# Patient Record
Sex: Female | Born: 1975 | Race: White | Hispanic: No | Smoking: Never smoker
Health system: Southern US, Community
[De-identification: ages and names within clinical notes are randomized; demographics above are authoritative.]

## PROBLEM LIST (undated history)

## (undated) DIAGNOSIS — N390 Urinary tract infection, site not specified: Secondary | ICD-10-CM

## (undated) DIAGNOSIS — Z8639 Personal history of other endocrine, nutritional and metabolic disease: Secondary | ICD-10-CM

## (undated) DIAGNOSIS — E079 Disorder of thyroid, unspecified: Secondary | ICD-10-CM

## (undated) DIAGNOSIS — Z83518 Family history of other specified eye disorder: Secondary | ICD-10-CM

## (undated) HISTORY — DX: Family history of other specified eye disorder: Z83.518

## (undated) HISTORY — DX: Personal history of other endocrine, nutritional and metabolic disease: Z86.39

## (undated) HISTORY — DX: Urinary tract infection, site not specified: N39.0

## (undated) HISTORY — DX: Disorder of thyroid, unspecified: E07.9

---

## 2018-01-21 ENCOUNTER — Ambulatory Visit
Admission: EM | Admit: 2018-01-21 | Discharge: 2018-01-21 | Disposition: A | Discharge status: Home / Self Care | Payer: 59 | Attending: Family Medicine | Admitting: Family Medicine

## 2018-01-21 ENCOUNTER — Ambulatory Visit (INDEPENDENT_AMBULATORY_CARE_PROVIDER_SITE_OTHER): Payer: 59

## 2018-01-21 ENCOUNTER — Other Ambulatory Visit: Payer: Self-pay

## 2018-01-21 DIAGNOSIS — M79671 Pain in right foot: Secondary | ICD-10-CM

## 2018-01-21 DIAGNOSIS — W2209XA Striking against other stationary object, initial encounter: Secondary | ICD-10-CM

## 2018-01-21 DIAGNOSIS — S92501A Displaced unspecified fracture of right lesser toe(s), initial encounter for closed fracture: Secondary | ICD-10-CM | POA: Diagnosis not present

## 2018-01-21 NOTE — ED Provider Notes (Signed)
[facility] ____________________________________________  Time seen: Approximately 830 PM  I have reviewed the triage vital signs and the nursing notes.   HISTORY  Chief Complaint Foot Pain   HPI Charlotte Davila is a 42 y.o. female presented for evaluation of right lateral foot pain post injury that occurred approximately 1 hour prior to arrival.  Patient reports that she is walking to the house and accidentally kicked an object that was there and she did not see.  Reports pain since.  Denies fall, head injury or loss conscious.  Denies pain radiation.  Reports decreased movement to fourth and fifth toes.  Denies previous issues to the same areas.  Has not taken any over-the-counter medications for the same complaints.  No alleviating measures attempted.  States pain is worse with direct palpation and actually trying to walk.  Reports otherwise feels well denies other complaints.  Patient's last menstrual period was 12/31/2017 (approximate). Denies pregnancy.    History reviewed. No pertinent past medical history.  There are no active problems to display for this patient.   Past Surgical History:  Procedure Laterality Date  . CESAREAN SECTION       No current facility-administered medications for this encounter.  No current outpatient medications on file.  Allergies Bactrim [sulfamethoxazole-trimethoprim]; Ciprofloxacin; and Chlorhexidine  History reviewed. No pertinent family history.  Social History Social History   Tobacco Use  . Smoking status: Never Smoker  . Smokeless tobacco: Never Used  Substance Use Topics  . Alcohol use: No    Frequency: Never  . Drug use: No    Review of Systems Constitutional: No fever/chills Cardiovascular: Denies chest pain. Respiratory: Denies shortness of breath. Musculoskeletal: Negative for back pain. As above.  Skin: Negative for rash.  ____________________________________________   PHYSICAL EXAM:  VITAL  SIGNS: ED Triage Vitals  Enc Vitals Group     BP 01/21/18 1952 (!) 151/90     Pulse Rate 01/21/18 1952 (!) 59     Resp -- 18     Temp 01/21/18 1952 97.8 F (36.6 C)     Temp Source 01/21/18 1952 Oral     SpO2 01/21/18 1952 100 %     Weight 01/21/18 1954 217 lb (98.4 kg)     Height 01/21/18 1954 5\' 4"  (1.626 m)     Head Circumference --      Peak Flow --      Pain Score 01/21/18 1954 8     Pain Loc --      Pain Edu? --      Excl. in GC? --     Constitutional: Alert and oriented. Well appearing and in no acute distress. Cardiovascular: Normal rate, regular rhythm. Grossly normal heart sounds.  Good peripheral circulation. Respiratory: Normal respiratory effort without tachypnea nor retractions. Breath sounds are clear and equal bilaterally. No wheezes, rales, rhonchi. Musculoskeletal:   Bilateral pedal pulses equal and easily palpated. Except: Right distal fourth and fifth metatarsals mild tenderness to direct palpation, moderate tenderness right fifth toe proximal phalanx and right fifth MTP joint, mild ecchymosis and swelling present right fifth proximal phalanx, decreased range of motion to fourth and fifth toes, mild pain to right foot with resisted dorsiflexion, no pain with plantarflexion, otherwise full range of motion present, normal distal sensation and capillary refill, right  lower extremity otherwise nontender.  Ambulatory with mild antalgic gait. Neurologic:  Normal speech and language. Speech is normal. No gait instability.  Skin:  Skin is warm, dry and intact. No rash  noted. Psychiatric: Mood and affect are normal. Speech and behavior are normal. Patient exhibits appropriate insight and judgment   ___________________________________________   LABS (all labs ordered are listed, but only abnormal results are displayed)  Labs Reviewed - No data to display  RADIOLOGY  Dg Foot Complete Right  Result Date: 01/21/2018 CLINICAL DATA:  Injured fifth toe tonight.  Pain and  swelling. EXAM: RIGHT FOOT COMPLETE - 3+ VIEW COMPARISON:  None. FINDINGS: Oblique coursing minimally displaced fracture involving the shaft of the fifth proximal phalanx. No articular involvement is identified. No other fractures are identified. Moderate-sized calcaneal heel spur is noted incidentally. IMPRESSION: Minimally displaced oblique coursing fifth proximal phalanx fracture without articular involvement. Electronically Signed   By: Rudie Meyer M.D.   On: 01/21/2018 20:16   ____________________________________________   PROCEDURES Procedures    INITIAL IMPRESSION / ASSESSMENT AND PLAN / ED COURSE  Pertinent labs & imaging results that were available during my care of the patient were reviewed by me and considered in my medical decision making (see chart for details).  Well-appearing patient.  No acute distress.  Presenting for evaluation of right lateral foot pain post mechanical injury that occurred prior to arrival.  Right foot x-ray reviewed, results per radiologist as above, minimally displaced oblique fifth proximal phalanx fracture without articular involvement present.  Fourth and fifth toes buddy taped, postop shoe given.  Encourage rest, ice, elevation, supportive care, over-the-counter Tylenol ibuprofen as needed.  Follow-up with podiatry.  Information given.  Discussed follow up and return parameters including no resolution or any worsening concerns. Patient verbalized understanding and agreed to plan.   ____________________________________________   FINAL CLINICAL IMPRESSION(S) / ED DIAGNOSES  Final diagnoses:  Closed fracture of phalanx of right fifth toe, initial encounter     ED Discharge Orders    None       Note: This dictation was prepared with Dragon dictation along with smaller phrase technology. Any transcriptional errors that result from this process are unintentional.         Renford Dills, NP 01/21/18 2115

## 2018-01-21 NOTE — Discharge Instructions (Signed)
Rest. Ice. Elevate. Buddy tape.   Follow up with your primary care physician or podiatry as above as discussed.  Return to Urgent care for new or worsening concerns.

## 2018-01-21 NOTE — ED Triage Notes (Addendum)
Patient c/o right foot pain and swelling. She states she kicked an object about an hour ago and started experiencing swelling and pain.

## 2018-01-24 ENCOUNTER — Telehealth: Payer: Self-pay

## 2018-01-24 NOTE — Telephone Encounter (Signed)
Called to follow up with patient since visit here at [facility]. Patient instructed to call back with any questions or concerns. MA[facility]  

## 2018-08-31 DIAGNOSIS — H20023 Recurrent acute iridocyclitis, bilateral: Secondary | ICD-10-CM | POA: Insufficient documentation

## 2019-01-04 IMAGING — CR DG FOOT COMPLETE 3+V*R*
3 series · 3 of 3 positions shown · non-contrast
Comparison: None.

CLINICAL DATA: Injured fifth toe tonight.  Pain and swelling.

EXAM:
RIGHT FOOT COMPLETE - 3+ VIEW

[foot ap]
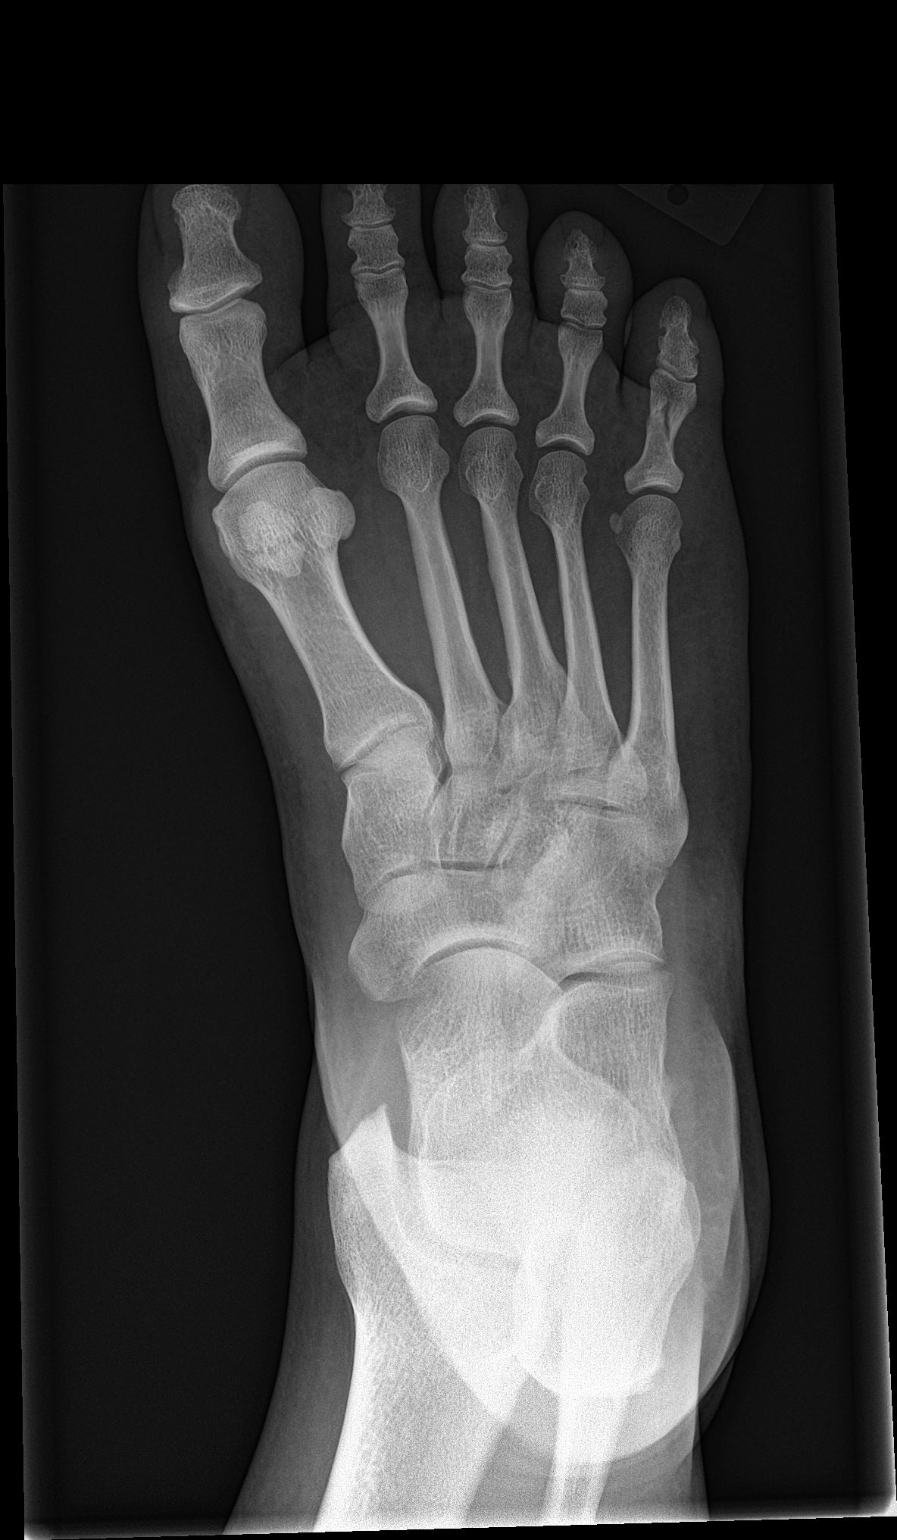

[foot obl]
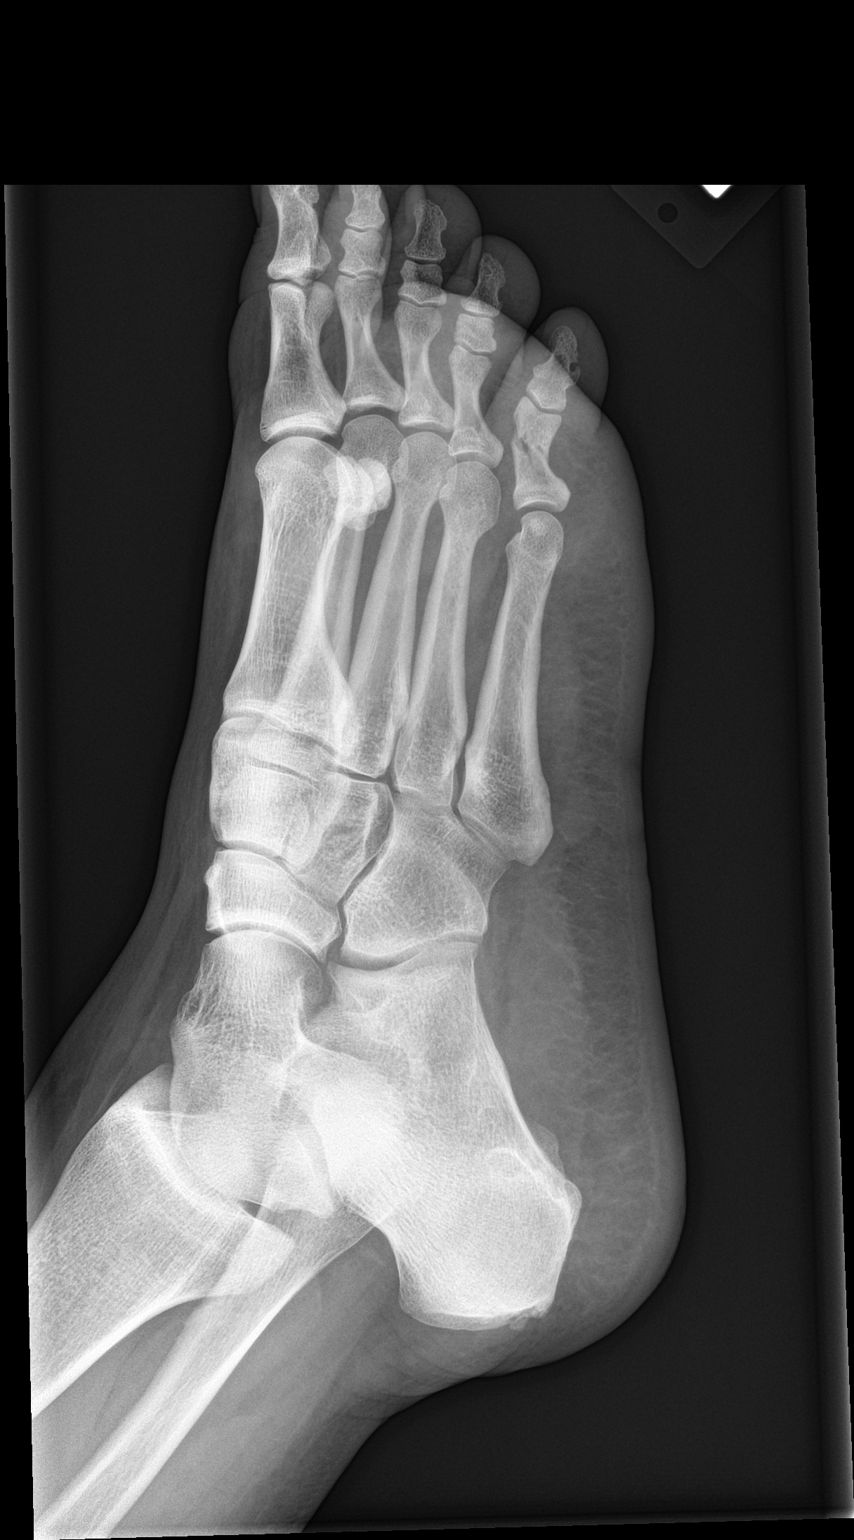

[foot lat]
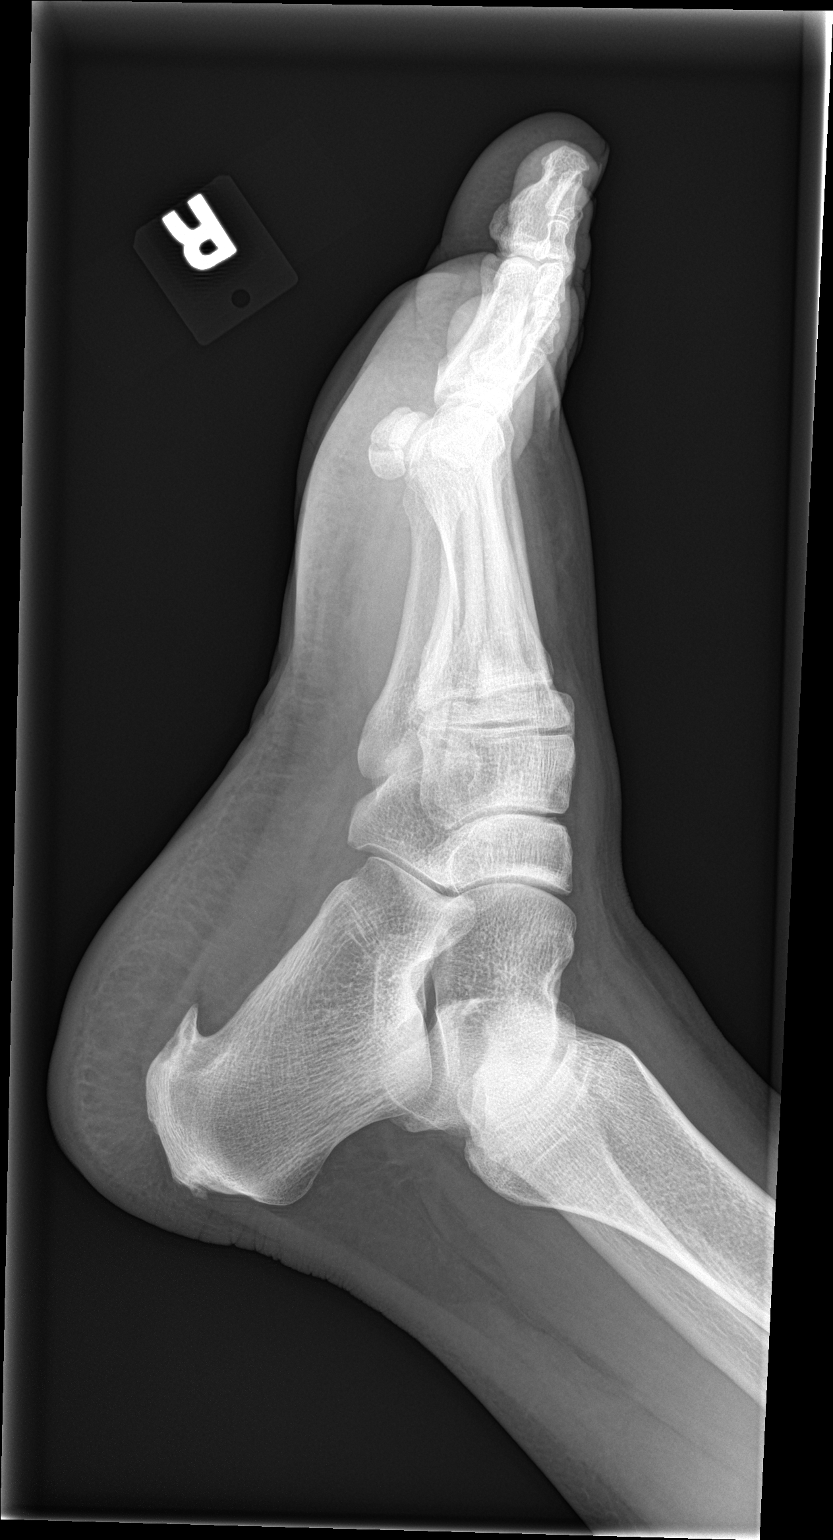

[3 of 3 positions shown; findings below may reference images not displayed]

FINDINGS: Oblique coursing minimally displaced fracture involving the shaft of
the fifth proximal phalanx. No articular involvement is identified.

No other fractures are identified. Moderate-sized calcaneal heel
spur is noted incidentally.
IMPRESSION: Minimally displaced oblique coursing fifth proximal phalanx fracture
without articular involvement.

## 2019-02-01 ENCOUNTER — Encounter: Payer: Self-pay | Admitting: Internal Medicine

## 2019-02-01 ENCOUNTER — Other Ambulatory Visit: Payer: Self-pay

## 2019-02-01 ENCOUNTER — Ambulatory Visit (INDEPENDENT_AMBULATORY_CARE_PROVIDER_SITE_OTHER): Payer: 59 | Admitting: Internal Medicine

## 2019-02-01 VITALS — BP 100/60 | HR 54 | Ht 65.0 in | Wt 230.0 lb

## 2019-02-01 DIAGNOSIS — Z8639 Personal history of other endocrine, nutritional and metabolic disease: Secondary | ICD-10-CM

## 2019-02-01 DIAGNOSIS — Z1231 Encounter for screening mammogram for malignant neoplasm of breast: Secondary | ICD-10-CM | POA: Diagnosis not present

## 2019-02-01 DIAGNOSIS — E785 Hyperlipidemia, unspecified: Secondary | ICD-10-CM

## 2019-02-01 DIAGNOSIS — L570 Actinic keratosis: Secondary | ICD-10-CM | POA: Diagnosis not present

## 2019-02-01 DIAGNOSIS — H20023 Recurrent acute iridocyclitis, bilateral: Secondary | ICD-10-CM | POA: Diagnosis not present

## 2019-02-01 DIAGNOSIS — R21 Rash and other nonspecific skin eruption: Secondary | ICD-10-CM | POA: Diagnosis not present

## 2019-02-01 DIAGNOSIS — L719 Rosacea, unspecified: Secondary | ICD-10-CM | POA: Insufficient documentation
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
## 2019-02-02 LAB — CBC WITH DIFFERENTIAL/PLATELET
BASOS: 1 %
Basophils Absolute: 0 10*3/uL (ref 0.0–0.2)
EOS (ABSOLUTE): 0.1 10*3/uL (ref 0.0–0.4)
EOS: 1 %
HEMATOCRIT: 41.2 % (ref 34.0–46.6)
HEMOGLOBIN: 13.4 g/dL (ref 11.1–15.9)
IMMATURE GRANS (ABS): 0 10*3/uL (ref 0.0–0.1)
Immature Granulocytes: 0 %
Lymphocytes Absolute: 3.4 10*3/uL — ABNORMAL HIGH (ref 0.7–3.1)
Lymphs: 45 %
MCH: 28.5 pg (ref 26.6–33.0)
MCHC: 32.5 g/dL (ref 31.5–35.7)
MCV: 88 fL (ref 79–97)
MONOCYTES: 6 %
Monocytes Absolute: 0.5 10*3/uL (ref 0.1–0.9)
NEUTROS ABS: 3.7 10*3/uL (ref 1.4–7.0)
Neutrophils: 47 %
Platelets: 330 10*3/uL (ref 150–450)
RBC: 4.7 x10E6/uL (ref 3.77–5.28)
RDW: 13.1 % (ref 11.7–15.4)
WBC: 7.7 10*3/uL (ref 3.4–10.8)

## 2019-02-02 LAB — COMPREHENSIVE METABOLIC PANEL
A/G RATIO: 1.7 (ref 1.2–2.2)
ALBUMIN: 4.5 g/dL (ref 3.8–4.8)
ALT: 23 IU/L (ref 0–32)
AST: 14 IU/L (ref 0–40)
Alkaline Phosphatase: 60 IU/L (ref 39–117)
BILIRUBIN TOTAL: 0.4 mg/dL (ref 0.0–1.2)
BUN / CREAT RATIO: 7 — AB (ref 9–23)
BUN: 6 mg/dL (ref 6–24)
CHLORIDE: 103 mmol/L (ref 96–106)
CO2: 22 mmol/L (ref 20–29)
Calcium: 9.8 mg/dL (ref 8.7–10.2)
Creatinine, Ser: 0.83 mg/dL (ref 0.57–1.00)
GFR calc non Af Amer: 87 mL/min/{1.73_m2} (ref >59)
GFR, EST AFRICAN AMERICAN: 101 mL/min/{1.73_m2} (ref >59)
GLUCOSE: 92 mg/dL (ref 65–99)
Globulin, Total: 2.6 g/dL (ref 1.5–4.5)
Potassium: 4.2 mmol/L (ref 3.5–5.2)
Sodium: 139 mmol/L (ref 134–144)
TOTAL PROTEIN: 7.1 g/dL (ref 6.0–8.5)

## 2019-02-02 LAB — LIPID PANEL
Chol/HDL Ratio: 3.7 ratio (ref 0.0–4.4)
Cholesterol, Total: 138 mg/dL (ref 100–199)
HDL: 37 mg/dL — ABNORMAL LOW (ref >39)
LDL Calculated: 78 mg/dL (ref 0–99)
Triglycerides: 115 mg/dL (ref 0–149)
VLDL Cholesterol Cal: 23 mg/dL (ref 5–40)

## 2019-02-02 LAB — THYROID PANEL WITH TSH
Free Thyroxine Index: 1.7 (ref 1.2–4.9)
T3 Uptake Ratio: 23 % — ABNORMAL LOW (ref 24–39)
T4 TOTAL: 7.3 ug/dL (ref 4.5–12.0)
TSH: 1.19 u[IU]/mL (ref 0.450–4.500)

## 2019-02-17 ENCOUNTER — Other Ambulatory Visit: Payer: Self-pay

## 2019-02-17 ENCOUNTER — Ambulatory Visit (INDEPENDENT_AMBULATORY_CARE_PROVIDER_SITE_OTHER): Payer: 59 | Admitting: Internal Medicine

## 2019-02-17 ENCOUNTER — Encounter: Payer: Self-pay | Admitting: Internal Medicine

## 2019-02-17 VITALS — BP 110/82 | HR 72 | Resp 16 | Ht 65.0 in | Wt 224.6 lb

## 2019-02-17 DIAGNOSIS — R002 Palpitations: Secondary | ICD-10-CM

## 2019-02-17 DIAGNOSIS — Z0000 Encounter for general adult medical examination without abnormal findings: Secondary | ICD-10-CM | POA: Diagnosis not present

## 2019-02-17 DIAGNOSIS — Z124 Encounter for screening for malignant neoplasm of cervix: Secondary | ICD-10-CM

## 2019-02-17 NOTE — Progress Notes (Addendum)
Date:  02/17/2019   Name:  Charlotte Davila   DOB:  Apr 17, 1976   MRN:  [id number]   Chief Complaint: Annual Exam (pap smear ) Charlotte Davila is a 43 y.o. female who presents today for her Complete Annual Exam. She feels fairly well. She reports exercising some. She reports she is sleeping fairly well.   Gynecologic Exam  The patient's pertinent negatives include no genital itching, genital lesions, pelvic pain or vaginal discharge. The patient is experiencing no pain. She is not pregnant. Pertinent negatives include no abdominal pain, chills, constipation, diarrhea, dysuria, fever, frequency, headaches, rash or vomiting.  Palpitations   This is a recurrent problem. The current episode started more than 1 year ago. The problem occurs intermittently. The problem has been unchanged. Nothing aggravates the symptoms. Associated symptoms include an irregular heartbeat. Pertinent negatives include no anxiety, chest pain, coughing, dizziness, fever, shortness of breath or vomiting. She has tried nothing for the symptoms. There are no known risk factors.   Lab Results  Component Value Date   TSH 1.190 02/01/2019   Lab Results  Component Value Date   CREATININE 0.83 02/01/2019   BUN 6 02/01/2019   NA 139 02/01/2019   K 4.2 02/01/2019   CL 103 02/01/2019   CO2 22 02/01/2019   Lab Results  Component Value Date   CHOL 138 02/01/2019   HDL 37 (L) 02/01/2019   LDLCALC 78 02/01/2019   TRIG 115 02/01/2019   CHOLHDL 3.7 02/01/2019     Review of Systems  Constitutional: Negative for chills, fatigue and fever.  HENT: Negative for congestion, hearing loss, tinnitus, trouble swallowing and voice change.   Eyes: Negative for visual disturbance.  Respiratory: Negative for cough, chest tightness, shortness of breath and wheezing.   Cardiovascular: Positive for palpitations (a few episodes this week). Negative for chest pain and leg swelling.  Gastrointestinal: Negative for abdominal pain, constipation,  diarrhea and vomiting.  Endocrine: Negative for polydipsia and polyuria.  Genitourinary: Negative for dysuria, frequency, genital sores, menstrual problem, pelvic pain, vaginal bleeding and vaginal discharge.  Musculoskeletal: Negative for arthralgias, gait problem and joint swelling.  Skin: Negative for color change and rash.  Neurological: Negative for dizziness, tremors, light-headedness and headaches.  Hematological: Negative for adenopathy. Does not bruise/bleed easily.  Psychiatric/Behavioral: Negative for dysphoric mood and sleep disturbance. The patient is not nervous/anxious.     Patient Active Problem List   Diagnosis Date Noted  . Keratosis 02/01/2019  . Facial rash 02/01/2019  . Hx of thyroid disease 02/01/2019  . Mild hyperlipidemia 02/01/2019  . 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 Bactrim [Sulfamethoxazole-Trimethoprim] Anaphylaxis  . Povidone-Iodine Hives  . Ciprofloxacin Other (See Comments)    Numbness and joint pain  . Chlorhexidine Rash    Past Surgical History:  Procedure Laterality Date  . CESAREAN SECTION     X4    Social History   Tobacco Use  . Smoking status: Never Smoker  . Smokeless tobacco: Never Used  Substance Use Topics  . Alcohol use: No    Frequency: Never  . Drug use: No     Medication list has been reviewed and updated.  Current Meds  Medication Sig  . Multiple Vitamin (MULTIVITAMIN) capsule Take 1 capsule by mouth daily.  . Multiple Vitamins-Minerals (HAIR SKIN AND NAILS FORMULA PO) Take by mouth.    PHQ 2/9 Scores 02/17/2019 02/01/2019  PHQ - 2 Score 0 0  PHQ- 9  Score 0 8    Physical Exam Vitals signs and nursing note reviewed.  Constitutional:      General: She is not in acute distress.    Appearance: She is well-developed.  HENT:     Head: Normocephalic and atraumatic.     Right Ear: Tympanic membrane and ear canal normal.     Left Ear: Tympanic membrane and ear canal  normal.     Nose:     Right Sinus: No maxillary sinus tenderness.     Left Sinus: No maxillary sinus tenderness.     Mouth/Throat:     Pharynx: Uvula midline.  Eyes:     General: No scleral icterus.       Right eye: No discharge.        Left eye: No discharge.     Conjunctiva/sclera: Conjunctivae normal.  Neck:     Musculoskeletal: Normal range of motion. No erythema.     Thyroid: No thyromegaly.     Vascular: No carotid bruit.  Cardiovascular:     Rate and Rhythm: Normal rate and regular rhythm.     Pulses: Normal pulses.     Heart sounds: Normal heart sounds.  Pulmonary:     Effort: Pulmonary effort is normal. No respiratory distress.     Breath sounds: No wheezing.  Chest:     Breasts:        Right: No mass, nipple discharge, skin change or tenderness.        Left: No mass, nipple discharge, skin change or tenderness.  Abdominal:     General: Bowel sounds are normal.     Palpations: Abdomen is soft.     Tenderness: There is no abdominal tenderness.     Hernia: There is no hernia in the right inguinal area or left inguinal area.  Genitourinary:    Labia:        Right: No rash, tenderness, lesion or injury.        Left: No rash, tenderness, lesion or injury.      Vagina: Normal.     Cervix: Normal.     Uterus: Normal.      Adnexa: Right adnexa normal and left adnexa normal.  Musculoskeletal: Normal range of motion.     Right lower leg: No edema.     Left lower leg: No edema.  Lymphadenopathy:     Cervical: No cervical adenopathy.  Skin:    General: Skin is warm and dry.     Findings: No rash.  Neurological:     Mental Status: She is alert and oriented to person, place, and time.     Cranial Nerves: No cranial nerve deficit.     Sensory: No sensory deficit.     Deep Tendon Reflexes: Reflexes are normal and symmetric.  Psychiatric:        Speech: Speech normal.        Behavior: Behavior normal.        Thought Content: Thought content normal.     BP 110/82    Pulse 72   Resp 16   Ht 5\' 5"  (1.651 m)   Wt 224 lb 9.6 oz (101.9 kg)   BMI 37.38 kg/m   Assessment and Plan: 1. Annual physical exam Pt to schedule mammogram - ordered Recent labs reviewed  2. Encounter for Papanicolaou smear for cervical cancer screening Obtained today - Cytology - PAP  3. Palpitations Check EKG Avoid caffeine, stay hydrated - EKG 12-Lead - NSR @ 66;  inverted T  waves in V1 and V2 not diagnostic   Partially dictated using Animal nutritionist. Any errors are unintentional.  Bari Edward, MD [facility]  02/17/2019

## 2019-02-22 LAB — PAP IG AND HPV HIGH-RISK
HPV, HIGH-RISK: NEGATIVE
PAP SMEAR COMMENT: 0

## 2019-05-31 ENCOUNTER — Telehealth: Payer: Self-pay

## 2019-05-31 ENCOUNTER — Other Ambulatory Visit: Payer: 59

## 2019-05-31 DIAGNOSIS — Z20822 Contact with and (suspected) exposure to covid-19: Secondary | ICD-10-CM

## 2019-05-31 NOTE — Telephone Encounter (Signed)
Thayer Headings with [facility]. Request COVID 19 test due to exposure. Scheduled for today.

## 2019-06-01 LAB — NOVEL CORONAVIRUS, NAA: SARS-CoV-2, NAA: NOT DETECTED

## 2019-10-10 ENCOUNTER — Encounter: Payer: Self-pay | Admitting: Emergency Medicine

## 2019-10-10 ENCOUNTER — Ambulatory Visit
Admission: EM | Admit: 2019-10-10 | Discharge: 2019-10-10 | Disposition: A | Discharge status: Home / Self Care | Payer: 59 | Attending: Urgent Care | Admitting: Urgent Care

## 2019-10-10 ENCOUNTER — Other Ambulatory Visit: Payer: Self-pay

## 2019-10-10 ENCOUNTER — Ambulatory Visit (INDEPENDENT_AMBULATORY_CARE_PROVIDER_SITE_OTHER): Payer: 59

## 2019-10-10 DIAGNOSIS — M79674 Pain in right toe(s): Secondary | ICD-10-CM

## 2019-10-10 DIAGNOSIS — S8011XA Contusion of right lower leg, initial encounter: Secondary | ICD-10-CM

## 2019-10-10 DIAGNOSIS — L03031 Cellulitis of right toe: Secondary | ICD-10-CM

## 2019-10-10 MED ORDER — MUPIROCIN CALCIUM 2 % EX CREA: TOPICAL_CREAM | CUTANEOUS | 0 refills | Status: DC

## 2019-10-10 MED ORDER — DOXYCYCLINE HYCLATE 100 MG PO CAPS
100.0000 mg | ORAL_CAPSULE | Freq: Two times a day (BID) | ORAL | 0 refills | Status: AC
Start: 2019-10-10 — End: 2019-10-17

## 2019-10-10 NOTE — ED Provider Notes (Signed)
[place], [place]   Name: Charlotte Davila DOB: Jul 25, 1976 MRN: [id number] CSN: [id number] PCP: Reubin Milan, MD  Arrival date and time:  10/10/19 1808  Chief Complaint:  Toe Injury   NOTE: Prior to seeing the patient today, I have reviewed the triage nursing documentation and vital signs. Clinical staff has updated patient's PMH/PSHx, current medication list, and drug allergies/intolerances to ensure comprehensive history available to assist in medical decision making.   History:   HPI: Charlotte Davila is a 43 y.o. female who presents today with complaints of pain and swelling to her RIGHT great toe following an injury that occurred yesterday (10/09/2019).  Patient reports that she was swinging on a metal swing when her foot got got caught resulting in her foot being drug across the cement.  Patient notes progressive pain and erythema to her toe since the incident occurred.  Also reports that the swelling struck her and the posterior aspect of her RIGHT distal tib-fib area.  She has not appreciated any increased redness or swelling in her leg.  Secondary to pain her lower extremity and toe, patient notes that ambulation is painful. Despite her symptoms, patient has not taken any over the counter interventions to help improve/relieve her reported symptoms at home.  Past Medical History:  Diagnosis Date  . Chronic UTI (urinary tract infection)   . FHx: macular degeneration   . H/O thyroid storm   . Thyroid disease     Past Surgical History:  Procedure Laterality Date  . CESAREAN SECTION     X4    Family History  Problem Relation Age of Onset  . Cervical cancer Mother   . Stroke Mother   . Hypertension Mother   . Atrial fibrillation Father   . Skin cancer Maternal Grandfather   . Skin cancer Paternal Grandmother   . Prostate cancer Paternal Grandmother     Social History   Tobacco Use  . Smoking status: Never Smoker  . Smokeless tobacco: Never Used  Substance Use Topics  .  Alcohol use: No    Frequency: Never  . Drug use: No    Patient Active Problem List   Diagnosis Date Noted  . Keratosis 02/01/2019  . Facial rash 02/01/2019  . Hx of thyroid disease 02/01/2019  . Mild hyperlipidemia 02/01/2019  . 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 Multiple Vitamin (MULTIVITAMIN) capsule Take 1 capsule by mouth daily.    Allergies:   Bactrim [sulfamethoxazole-trimethoprim], Povidone-iodine, Cephalexin, Ciprofloxacin, and Chlorhexidine  Review of Systems (ROS): Review of Systems  Constitutional: Negative for chills and fever.  Respiratory: Negative for cough and shortness of breath.   Cardiovascular: Negative for chest pain and palpitations.  Musculoskeletal: Positive for gait problem (2/2 acute injury).       Pain in RIGHT lower extremity and great toe  Skin: Positive for color change and wound.  All other systems reviewed and are negative.    Vital Signs: Today's Vitals   10/10/19 1824 10/10/19 1828 10/10/19 1932  BP:  (!) 137/96   Pulse:  81   Resp:  18   Temp:  98.3 F (36.8 C)   TempSrc:  Oral   SpO2:  98%   Weight: 220 lb (99.8 kg)    Height: 5\' 4"  (1.626 m)    PainSc: 8   8     Physical Exam: Physical Exam  Constitutional: She is oriented to person, place, and  time and well-developed, well-nourished, and in no distress.  HENT:  Head: Normocephalic and atraumatic.  Mouth/Throat: Mucous membranes are normal.  Eyes: Pupils are equal, round, and reactive to light.  Cardiovascular: Normal rate and intact distal pulses.  Pulmonary/Chest: Effort normal. No respiratory distress.  Musculoskeletal:     Right lower leg: She exhibits tenderness (see marked location; no erythema, warmth, or swelling. No compromised skin integrity. No ecchymosis). She exhibits no swelling and no deformity.       Legs:     Comments: RIGHT lower leg: No claudication pain.  Homan sign negative.  RIGHT  great toe: Areas of abrasion noted.  Digit swollen and TTP.  Swelling and erythema extend from proximal aspect of digit down to MTP joint.  No drainage or areas of visible purulent material.  Neurological: She is alert and oriented to person, place, and time. She has normal sensation.  Skin: Skin is warm and dry. No rash noted.  Psychiatric: Mood, memory, affect and judgment normal.  Nursing note and vitals reviewed.   Urgent Care Treatments / Results:   LABS: PLEASE NOTE: all labs that were ordered this encounter are listed, however only abnormal results are displayed. Labs Reviewed - No data to display  EKG: -None  RADIOLOGY: Dg Toe Great Right  Result Date: 10/10/2019 CLINICAL DATA:  Trauma yesterday with pain and swelling. EXAM: RIGHT GREAT TOE COMPARISON:  None. FINDINGS: No definite fracture is seen. On the lateral view, one could question a minimal chip fracture of the plantar cortex. Otherwise negative. IMPRESSION: No definite fracture. Question minimal chip fracture of the plantar cortex seen only on the lateral view. Electronically Signed   By: Paulina FusiMark  Shogry M.D.   On: 10/10/2019 19:19    PROCEDURES: Procedures  MEDICATIONS RECEIVED THIS VISIT: Medications - No data to display  PERTINENT CLINICAL COURSE NOTES/UPDATES:   Initial Impression / Assessment and Plan / Urgent Care Course:  Pertinent labs & imaging results that were available during my care of the patient were personally reviewed by me and considered in my medical decision making (see lab/imaging section of note for values and interpretations).  Charlotte Davila is a 43 y.o. female who [facility] with complaints of Toe Injury   Patient is well appearing overall in clinic today. She does not appear to be in any acute distress. Presenting symptoms (see HPI) and exam as documented above. Exam concerning for developing cellulitis as RIGHT great toe is swollen, erythematous, tender, and warm to  the touch. There are areas of abrasion noted. Diagnostic radiographs of the RIGHT great toe revealed no definite fracture. Radiologist questioned small chip fracture of the plantar cortex in the lateral view radiograph. Patient made aware of findings on radiographs today. She was advised that if pain persists despite conservative management she would need to see orthopedics or podiatry for further evaluation; verbalized understanding. Patient encouraged to keep toe clean and dry. Discussed soaking foot BID in warm Epson salt water. Allergic to SMZ-TMP, ciprofloxacin, and cephalexin. Will cover infection with topical mupirocin and oral doxycycline. Patient to monitor for signs and symptoms of increasing infection, which would include increased redness, swelling, streaking, drainage, pain, and the development of a fever.  We discussed use of APAP and/or IBU as needed for pain.  Regarding the pain in the posterior aspect of her distal tib-fib area. Suspect contusion secondary to blunt force trauma injury. There is no ecchymosis, erythema, warmth, or swelling noted. PT/DP pulses intact. Extremity with normal  sensation, color, and capillary refill noted distally. She denies claudication pain with dorsiflexion of the foot. She mentions that she wanted to make sure she did not have a DVT. She denies an associated SOB. Discussed that there were little to no concern for DVT and that her pain likely represented an area of simple contusion. However, with that being said, she was advised that DVT could not be fully ruled out without ultrasound imaging of the extremity. Options were discussed. I offered to schedule an outpatient ultrasound for her versus having her seen for further evaluation in the emergency department tonight. Patient declined both options for imaging and is opting to monitor extremity for progressive symptoms that would be concerning for a DVT. 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  Discussed follow up with primary care physician in 1 week for re-evaluation if not improving, or sooner if there are any additional concerns. I have reviewed the follow up and strict return precautions for any new or worsening symptoms. Patient is aware of symptoms that would be deemed urgent/emergent, and would thus require further evaluation either here or in the emergency department. At the time of discharge, she verbalized understanding and consent with the discharge plan as it was reviewed with her. All questions were fielded by provider and/or clinic staff prior to patient discharge.    Final Clinical Impressions / Urgent Care Diagnoses:   Final diagnoses:  Cellulitis of great toe of right foot  Contusion of right lower leg, initial encounter    New Prescriptions:  [place] Controlled Substance Registry consulted? Not Applicable  Meds ordered this encounter  Medications  . mupirocin cream (BACTROBAN) 2 %    Sig: AAA BID until healed.    Dispense:  15 g    Refill:  0  . doxycycline (VIBRAMYCIN) 100 MG capsule    Sig: Take 1 capsule (100 mg total) by mouth 2 (two) times daily for 7 days.    Dispense:  14 capsule    Refill:  0    Recommended Follow up Care:  Patient encouraged to follow up with the following provider within the specified time frame, or sooner as dictated by the severity of her symptoms. As always, she was instructed that for any urgent/emergent care needs, she should seek care either here or in the emergency department for more immediate evaluation.  Follow-up Information    Glean Hess, MD In 1 week.   Specialty: Internal Medicine Why: General reassessment of symptoms if not improving Contact information: [address] [place] [address] [phone]         NOTE: This note was prepared using Dragon dictation software along with smaller phrase technology. Despite my best ability  to proofread, there is the potential that transcriptional errors may still occur from this process, and are completely unintentional.    Karen Kitchens, NP 10/11/19 1947

## 2019-10-10 NOTE — Discharge Instructions (Addendum)
It was very nice seeing you today in clinic. Thank you for entrusting me with your care.   Soak foot in fat Epson salt water at least once daily. Keep wounds clean and dry. Apply antibiotic ointment twice a day until healed. Take antibiotics until you have finished the prescribed course.   Make arrangements to follow up with your regular doctor in 1 week for re-evaluation if not improving. If your symptoms/condition worsens, please seek follow up care either here or in the ER. Please remember, our [facility] providers are "right here with you" when you need [place].   Again, it was my pleasure to take care of you today. Thank you for choosing our clinic. I hope that you start to feel better quickly.   Honor Loh, MSN, APRN, FNP-C, CEN Advanced Practice Provider [facility]

## 2019-10-10 NOTE — ED Triage Notes (Signed)
Patient c/o swinging on a swing and she hit her right great toe on the cement. She states this happened yesterday. Patient c/o pain, swelling and redness to the right great toe.

## 2019-12-29 ENCOUNTER — Ambulatory Visit
Admission: RE | Admit: 2019-12-29 | Discharge: 2019-12-29 | Disposition: A | Discharge status: Home / Self Care | Payer: 59 | Source: Ambulatory Visit | Attending: Internal Medicine | Admitting: Internal Medicine

## 2019-12-29 ENCOUNTER — Other Ambulatory Visit: Payer: Self-pay

## 2019-12-29 ENCOUNTER — Other Ambulatory Visit
Admission: RE | Admit: 2019-12-29 | Discharge: 2019-12-29 | Disposition: A | Discharge status: Home / Self Care | Payer: 59 | Source: Home / Self Care | Attending: Internal Medicine | Admitting: Internal Medicine

## 2019-12-29 ENCOUNTER — Ambulatory Visit
Admission: RE | Admit: 2019-12-29 | Discharge: 2019-12-29 | Disposition: A | Discharge status: Home / Self Care | Payer: 59 | Attending: Internal Medicine | Admitting: Internal Medicine

## 2019-12-29 ENCOUNTER — Ambulatory Visit (INDEPENDENT_AMBULATORY_CARE_PROVIDER_SITE_OTHER): Payer: 59 | Admitting: Internal Medicine

## 2019-12-29 ENCOUNTER — Encounter: Payer: Self-pay | Admitting: Internal Medicine

## 2019-12-29 VITALS — BP 118/74 | HR 64 | Temp 97.8°F | Ht 65.0 in | Wt 210.0 lb

## 2019-12-29 DIAGNOSIS — R1031 Right lower quadrant pain: Secondary | ICD-10-CM | POA: Insufficient documentation

## 2019-12-29 LAB — CBC WITH DIFFERENTIAL/PLATELET
Abs Immature Granulocytes: 0.04 10*3/uL (ref 0.00–0.07)
Basophils Absolute: 0 10*3/uL (ref 0.0–0.1)
Basophils Relative: 0 %
Eosinophils Absolute: 0.1 10*3/uL (ref 0.0–0.5)
Eosinophils Relative: 1 %
HCT: 37.7 % (ref 36.0–46.0)
Hemoglobin: 12.6 g/dL (ref 12.0–15.0)
Immature Granulocytes: 0 %
Lymphocytes Relative: 26 %
Lymphs Abs: 2.7 10*3/uL (ref 0.7–4.0)
MCH: 29.1 pg (ref 26.0–34.0)
MCHC: 33.4 g/dL (ref 30.0–36.0)
MCV: 87.1 fL (ref 80.0–100.0)
Monocytes Absolute: 0.6 10*3/uL (ref 0.1–1.0)
Monocytes Relative: 6 %
Neutro Abs: 7 10*3/uL (ref 1.7–7.7)
Neutrophils Relative %: 67 %
Platelets: 285 10*3/uL (ref 150–400)
RBC: 4.33 MIL/uL (ref 3.87–5.11)
RDW: 13.5 % (ref 11.5–15.5)
WBC: 10.3 10*3/uL (ref 4.0–10.5)
nRBC: 0 % (ref 0.0–0.2)

## 2019-12-29 MED ORDER — AMOXICILLIN-POT CLAVULANATE 875-125 MG PO TABS: 1.0000 | ORAL_TABLET | Freq: Two times a day (BID) | ORAL | 0 refills | Status: AC

## 2019-12-29 NOTE — Progress Notes (Signed)
Date:  12/29/2019   Name:  Charlotte Davila   DOB:  12-26-1975   MRN:  [id number]   Chief Complaint: Abdominal Pain (Started yesterday. Mid stomach. Destended, tender and stabbing pains. Have not ate since 10 am yesterday. Keeping down fluids. Worse when sitting in one position for too long. )  Abdominal Pain This is a new problem. The current episode started yesterday. The problem occurs intermittently. The problem has been unchanged. The pain is located in the epigastric region. The pain is moderate. The quality of the pain is a sensation of fullness, sharp and aching. Associated symptoms include belching and a fever (101.2  yesterday but none today). Pertinent negatives include no constipation, diarrhea, dysuria, headaches, hematuria, nausea or vomiting.    Lab Results  Component Value Date   CREATININE 0.83 02/01/2019   BUN 6 02/01/2019   NA 139 02/01/2019   K 4.2 02/01/2019   CL 103 02/01/2019   CO2 22 02/01/2019   Lab Results  Component Value Date   CHOL 138 02/01/2019   HDL 37 (L) 02/01/2019   LDLCALC 78 02/01/2019   TRIG 115 02/01/2019   CHOLHDL 3.7 02/01/2019   Lab Results  Component Value Date   TSH 1.190 02/01/2019   No results found for: HGBA1C   Review of Systems  Constitutional: Positive for fever (101.2  yesterday but none today).  Respiratory: Negative for cough, chest tightness, shortness of breath and wheezing.   Cardiovascular: Negative for chest pain, palpitations and leg swelling.  Gastrointestinal: Positive for abdominal distention and abdominal pain. Negative for constipation, diarrhea, nausea and vomiting.  Genitourinary: Negative for dysuria, hematuria and menstrual problem (no possibility of pregnancy).  Neurological: Negative for dizziness, light-headedness and headaches.    Patient Active Problem List   Diagnosis Date Noted  . Keratosis 02/01/2019  . Facial rash 02/01/2019  . Hx of thyroid disease 02/01/2019  . Mild hyperlipidemia 02/01/2019    . 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 Bactrim [Sulfamethoxazole-Trimethoprim] Anaphylaxis  . Povidone-Iodine Hives  . Cephalexin   . Ciprofloxacin Other (See Comments)    Numbness and joint pain  . Chlorhexidine Rash    Past Surgical History:  Procedure Laterality Date  . CESAREAN SECTION     X4    Social History   Tobacco Use  . Smoking status: Never Smoker  . Smokeless tobacco: Never Used  Substance Use Topics  . Alcohol use: No  . Drug use: No     Medication list has been reviewed and updated.  Current Meds  Medication Sig  . Multiple Vitamin (MULTIVITAMIN) capsule Take 1 capsule by mouth daily.    PHQ 2/9 Scores 12/29/2019 02/17/2019 02/01/2019  PHQ - 2 Score 0 0 0  PHQ- 9 Score - 0 8    BP Readings from Last 3 Encounters:  12/29/19 118/74  10/10/19 (!) 137/96  02/17/19 110/82    Physical Exam Constitutional:      General: She is in acute distress.     Appearance: She is not toxic-appearing.  Abdominal:     General: Abdomen is protuberant. Bowel sounds are normal. There is distension.     Palpations: Abdomen is soft.     Tenderness: There is generalized abdominal tenderness (greatest in the RLQ). There is no right CVA tenderness, left CVA tenderness, guarding or rebound.  Neurological:     Mental Status: She is alert.     Wt Readings from Last 3  Encounters:  12/29/19 210 lb (95.3 kg)  10/10/19 220 lb (99.8 kg)  02/17/19 224 lb 9.6 oz (101.9 kg)    BP 118/74   Pulse 64   Temp 97.8 F (36.6 C) (Oral)   Ht 5\' 5"  (1.651 m)   Wt 210 lb (95.3 kg)   SpO2 99%   BMI 34.95 kg/m   Assessment and Plan: 1. Right lower quadrant abdominal pain Suspicious for subacute appendicitis or diverticulitis Will get xray and stat CBC Start Augmentin Increase fluids Pt given precautions to go to the ER - worsening pain, diarrhea, inability to keep down fluids or fever - amoxicillin-clavulanate (AUGMENTIN) 875-125 MG  tablet; Take 1 tablet by mouth 2 (two) times daily for 10 days.  Dispense: 20 tablet; Refill: 0 - CBC with Differential/Platelet - DG Abd 2 Views; Future   Partially dictated using Editor, commissioning. Any errors are unintentional.  Halina Maidens, MD [facility]  12/29/2019

## 2020-09-06 ENCOUNTER — Telehealth: Payer: Self-pay | Admitting: Internal Medicine

## 2020-09-06 NOTE — Telephone Encounter (Signed)
Copied from CRM [id number]. Topic: Appointment Scheduling - Scheduling Inquiry for Clinic >> Sep 06, 2020  2:05 PM Randol Kern wrote: Reason for CRM: Pt scheduled appt but wants a sooner available slot

## 2020-09-10 ENCOUNTER — Other Ambulatory Visit: Payer: Self-pay

## 2020-09-10 ENCOUNTER — Ambulatory Visit (INDEPENDENT_AMBULATORY_CARE_PROVIDER_SITE_OTHER): Payer: 59 | Admitting: Internal Medicine

## 2020-09-10 ENCOUNTER — Encounter: Payer: Self-pay | Admitting: Internal Medicine

## 2020-09-10 ENCOUNTER — Other Ambulatory Visit: Payer: Self-pay | Admitting: Internal Medicine

## 2020-09-10 VITALS — BP 132/72 | HR 69 | Ht 65.0 in | Wt 229.0 lb

## 2020-09-10 DIAGNOSIS — G4720 Circadian rhythm sleep disorder, unspecified type: Secondary | ICD-10-CM | POA: Diagnosis not present

## 2020-09-10 DIAGNOSIS — R635 Abnormal weight gain: Secondary | ICD-10-CM

## 2020-09-10 DIAGNOSIS — N926 Irregular menstruation, unspecified: Secondary | ICD-10-CM

## 2020-09-10 DIAGNOSIS — R41840 Attention and concentration deficit: Secondary | ICD-10-CM | POA: Diagnosis not present

## 2020-09-10 NOTE — Progress Notes (Signed)
Date:  09/10/2020   Name:  Charlotte Davila   DOB:  02-28-1976   MRN:  [id number]   Chief Complaint: Menstrual Problem (Thinks she is going through perimenopause. Fatigued and focusing issues. ) She has been having irregular menses for the past 6 months.  Most months she has a normal cycle then prolonged spotting for several more weeks.  She is hot at night but no real sweats.  No hot flashes during the day. Her work is suffering from problems with concentration and staying on task.  She is under probation which is a new issue for her.  She has always done very well in the past. Her sleep is disrupted early many nights - various reasons include bad dreams or more worrying.  She has no issues going to sleep. She is trying to exercise about 2-3 times per week.  She has gained some weight over the past year - about 10 lbs.  She takes a multivitamin daily.  She occasionally takes a nap when she feels very tired, which is more often than in the past. She is concerned that these sx may be caused by hormonal changes associated with peri-menopause.  She can not take birth control pills due to severe migraines as a SE. HPI  Lab Results  Component Value Date   CREATININE 0.83 02/01/2019   BUN 6 02/01/2019   NA 139 02/01/2019   K 4.2 02/01/2019   CL 103 02/01/2019   CO2 22 02/01/2019   Lab Results  Component Value Date   CHOL 138 02/01/2019   HDL 37 (L) 02/01/2019   LDLCALC 78 02/01/2019   TRIG 115 02/01/2019   CHOLHDL 3.7 02/01/2019   Lab Results  Component Value Date   TSH 1.190 02/01/2019   No results found for: HGBA1C Lab Results  Component Value Date   WBC 10.3 12/29/2019   HGB 12.6 12/29/2019   HCT 37.7 12/29/2019   MCV 87.1 12/29/2019   PLT 285 12/29/2019   Lab Results  Component Value Date   ALT 23 02/01/2019   AST 14 02/01/2019   ALKPHOS 60 02/01/2019   BILITOT 0.4 02/01/2019     Review of Systems  Constitutional: Positive for fatigue. Negative for chills,  diaphoresis, fever and unexpected weight change.  HENT: Negative for trouble swallowing.   Respiratory: Negative for cough, chest tightness and shortness of breath.   Cardiovascular: Negative for chest pain and palpitations.  Genitourinary: Positive for menstrual problem. Negative for pelvic pain and vaginal discharge.  Neurological: Negative for dizziness, light-headedness and headaches.  Psychiatric/Behavioral: Positive for decreased concentration and sleep disturbance. Negative for dysphoric mood. The patient is not nervous/anxious.     Patient Active Problem List   Diagnosis Date Noted  . Keratosis 02/01/2019  . Facial rash 02/01/2019  . Hx of thyroid disease 02/01/2019  . Mild hyperlipidemia 02/01/2019  . 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 Bactrim [Sulfamethoxazole-Trimethoprim] Anaphylaxis  . Povidone-Iodine Hives  . Cephalexin   . Ciprofloxacin Other (See Comments)    Numbness and joint pain  . Chlorhexidine Rash    Past Surgical History:  Procedure Laterality Date  . CESAREAN SECTION     X4    Social History   Tobacco Use  . Smoking status: Never Smoker  . Smokeless tobacco: Never Used  Vaping Use  . Vaping Use: Never assessed  Substance Use Topics  . Alcohol use: No  . Drug use: No  Medication list has been reviewed and updated.  Current Meds  Medication Sig  . Biotin 1 MG CAPS Take by mouth.  . Cholecalciferol 25 MCG (1000 UT) tablet Take by mouth.  . doxycycline (VIBRAMYCIN) 100 MG capsule Take 100 mg by mouth 2 (two) times daily.  . Multiple Vitamin (MULTIVITAMIN) capsule Take 1 capsule by mouth daily.  . RHOFADE 1 % CREA APPLY ONE PEA SIZE AMOUNT ON EACH CHEEK AND SPREAD TO ALL AFFECTED AREAS  . SOOLANTRA 1 % CREA Apply 1 application topically daily.    PHQ 2/9 Scores 09/10/2020 12/29/2019 02/17/2019 02/01/2019  PHQ - 2 Score 0 0 0 0  PHQ- 9 Score 6 - 0 8    GAD 7 : Generalized Anxiety Score 09/10/2020   Nervous, Anxious, on Edge 0  Control/stop worrying 0  Worry too much - different things 0  Trouble relaxing 0  Restless 0  Easily annoyed or irritable 0  Afraid - awful might happen 0  Total GAD 7 Score 0  Anxiety Difficulty Not difficult at all    BP Readings from Last 3 Encounters:  09/10/20 132/72  12/29/19 118/74  10/10/19 (!) 137/96    Physical Exam Vitals and nursing note reviewed.  Constitutional:      General: She is not in acute distress.    Appearance: Normal appearance. She is well-developed.  HENT:     Head: Normocephalic and atraumatic.  Eyes:     Conjunctiva/sclera: Conjunctivae normal.  Cardiovascular:     Rate and Rhythm: Normal rate and regular rhythm.     Heart sounds: No murmur heard.   Pulmonary:     Effort: Pulmonary effort is normal. No respiratory distress.     Breath sounds: No wheezing or rhonchi.  Musculoskeletal:        General: Normal range of motion.     Cervical back: Normal range of motion.  Lymphadenopathy:     Cervical: No cervical adenopathy.  Skin:    General: Skin is warm and dry.     Findings: No rash.  Neurological:     Mental Status: She is alert and oriented to person, place, and time.  Psychiatric:        Attention and Perception: Attention normal.        Mood and Affect: Mood is depressed (concerned about losing her job).        Speech: Speech normal.        Behavior: Behavior normal.        Thought Content: Thought content normal.        Cognition and Memory: Cognition normal.     Wt Readings from Last 3 Encounters:  09/10/20 229 lb (103.9 kg)  12/29/19 210 lb (95.3 kg)  10/10/19 220 lb (99.8 kg)    BP 132/72 (BP Location: Right Arm, Patient Position: Sitting, Cuff Size: Large)   Pulse 69   Ht 5\' 5"  (1.651 m)   Wt 229 lb (103.9 kg)   LMP  (Exact Date) Comment: Period on and off every couple of days  SpO2 100%   BMI 38.11 kg/m   Assessment and Plan: 1. Irregular menstrual cycle Will check labs for  anemia, menopause and metabolic abnormalities Consider low dose OCP or GYN referral for options if needed - CBC with Differential/Platelet - Comprehensive metabolic panel - FSH/LH  2. Disturbed sleep rhythm Pt is very sensitive to many otc medications and has paradoxical reactions to sedatives like benadryl and cold/sinus meds like claritin and sudafed Would not  recommend any sleep aids at this time - esp since sleep initiation is not the issue   3. Weight gain Rule out thyroid abnormality as contributing - TSH+T4F+T3Free  4. Disturbed concentration Will check B12 Letter written to employer advising medical work up in progress - Vitamin B12   Partially dictated using Animal nutritionist. Any errors are unintentional.  Bari Edward, MD [facility]  09/10/2020

## 2020-09-11 LAB — COMPREHENSIVE METABOLIC PANEL
ALT: 25 IU/L (ref 0–32)
AST: 20 IU/L (ref 0–40)
Albumin/Globulin Ratio: 1.7 (ref 1.2–2.2)
Albumin: 4.7 g/dL (ref 3.8–4.8)
Alkaline Phosphatase: 65 IU/L (ref 44–121)
BUN/Creatinine Ratio: 10 (ref 9–23)
BUN: 9 mg/dL (ref 6–24)
Bilirubin Total: 0.4 mg/dL (ref 0.0–1.2)
CO2: 23 mmol/L (ref 20–29)
Calcium: 9.8 mg/dL (ref 8.7–10.2)
Chloride: 100 mmol/L (ref 96–106)
Creatinine, Ser: 0.86 mg/dL (ref 0.57–1.00)
GFR calc Af Amer: 95 mL/min/{1.73_m2} (ref >59)
GFR calc non Af Amer: 82 mL/min/{1.73_m2} (ref >59)
Globulin, Total: 2.8 g/dL (ref 1.5–4.5)
Glucose: 85 mg/dL (ref 65–99)
Potassium: 4.1 mmol/L (ref 3.5–5.2)
Sodium: 139 mmol/L (ref 134–144)
Total Protein: 7.5 g/dL (ref 6.0–8.5)

## 2020-09-11 LAB — CBC WITH DIFFERENTIAL/PLATELET
Basophils Absolute: 0 10*3/uL (ref 0.0–0.2)
Basos: 0 %
EOS (ABSOLUTE): 0.1 10*3/uL (ref 0.0–0.4)
Eos: 1 %
Hematocrit: 41.4 % (ref 34.0–46.6)
Hemoglobin: 13.2 g/dL (ref 11.1–15.9)
Immature Grans (Abs): 0 10*3/uL (ref 0.0–0.1)
Immature Granulocytes: 0 %
Lymphocytes Absolute: 3.3 10*3/uL — ABNORMAL HIGH (ref 0.7–3.1)
Lymphs: 38 %
MCH: 28 pg (ref 26.6–33.0)
MCHC: 31.9 g/dL (ref 31.5–35.7)
MCV: 88 fL (ref 79–97)
Monocytes Absolute: 0.6 10*3/uL (ref 0.1–0.9)
Monocytes: 6 %
Neutrophils Absolute: 4.7 10*3/uL (ref 1.4–7.0)
Neutrophils: 55 %
Platelets: 349 10*3/uL (ref 150–450)
RBC: 4.71 x10E6/uL (ref 3.77–5.28)
RDW: 12.9 % (ref 11.7–15.4)
WBC: 8.7 10*3/uL (ref 3.4–10.8)

## 2020-09-11 LAB — FSH/LH
FSH: 6.7 m[IU]/mL
LH: 11.1 m[IU]/mL

## 2020-09-11 LAB — TSH+T4F+T3FREE
Free T4: 1.01 ng/dL (ref 0.82–1.77)
T3, Free: 2.6 pg/mL (ref 2.0–4.4)
TSH: 0.919 u[IU]/mL (ref 0.450–4.500)

## 2020-09-11 LAB — VITAMIN B12: Vitamin B-12: 619 pg/mL (ref 232–1245)

## 2020-09-12 ENCOUNTER — Other Ambulatory Visit: Payer: Self-pay

## 2020-09-12 DIAGNOSIS — F909 Attention-deficit hyperactivity disorder, unspecified type: Secondary | ICD-10-CM

## 2020-09-16 ENCOUNTER — Ambulatory Visit: Payer: 59 | Admitting: Internal Medicine

## 2020-10-20 ENCOUNTER — Encounter: Payer: Self-pay | Admitting: Emergency Medicine

## 2020-10-20 ENCOUNTER — Ambulatory Visit
Admission: EM | Admit: 2020-10-20 | Discharge: 2020-10-20 | Disposition: A | Discharge status: Home / Self Care | Payer: 59 | Attending: Family Medicine | Admitting: Family Medicine

## 2020-10-20 ENCOUNTER — Ambulatory Visit (INDEPENDENT_AMBULATORY_CARE_PROVIDER_SITE_OTHER): Payer: 59

## 2020-10-20 ENCOUNTER — Other Ambulatory Visit: Payer: Self-pay

## 2020-10-20 DIAGNOSIS — R059 Cough, unspecified: Secondary | ICD-10-CM

## 2020-10-20 DIAGNOSIS — J019 Acute sinusitis, unspecified: Secondary | ICD-10-CM | POA: Diagnosis not present

## 2020-10-20 DIAGNOSIS — R0981 Nasal congestion: Secondary | ICD-10-CM | POA: Diagnosis not present

## 2020-10-20 DIAGNOSIS — R509 Fever, unspecified: Secondary | ICD-10-CM | POA: Diagnosis not present

## 2020-10-20 MED ORDER — AMOXICILLIN-POT CLAVULANATE 875-125 MG PO TABS: 1.0000 | ORAL_TABLET | Freq: Two times a day (BID) | ORAL | 0 refills | Status: DC

## 2020-10-20 NOTE — ED Provider Notes (Signed)
[facility]    CSN: [id number] Arrival date & time: 10/20/20  0901  History   Chief Complaint Chief Complaint  Patient presents with   Sinus Problem   Nasal Congestion   HPI   44 year old female presents with congestion, associated dental pain, cough and fever.  Patient states that she has ongoing issues with postnasal drip.  Has been worse over the past week.  She had a fever last night of 100.9.  She has had ongoing cough and congestion particular since Thursday.  She has been taking Tylenol and vitamins without relief.  She states she has a history of pneumonia and is concerned that this may be evolving.  Pain is 4/10 in severity.  She is had a recent sick contact with RSV.  No other reported symptoms.  No other complaints.  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      Home Medications    Prior to Admission medications   Medication Sig Start Date End Date Taking? Authorizing Provider  Multiple Vitamin (MULTIVITAMIN) capsule Take 1 capsule by mouth daily.   Yes [provider]  amoxicillin-clavulanate (AUGMENTIN) 875-125 MG tablet Take 1 tablet by mouth 2 (two) times daily. 10/20/20   Tommie Sams, DO  Cholecalciferol 25 MCG (1000 UT) tablet Take by mouth.    [provider]  RHOFADE 1 % CREA APPLY ONE PEA SIZE AMOUNT ON EACH CHEEK AND SPREAD TO ALL AFFECTED AREAS 09/03/20   [provider]  SOOLANTRA 1 % CREA Apply 1 application topically daily. 09/03/20   [provider]    Family History Family History  Problem Relation Age of Onset   Cervical cancer Mother    Stroke Mother    Hypertension Mother    Atrial fibrillation Father    Skin cancer Maternal Grandfather    Skin cancer Paternal Grandmother    Prostate cancer Paternal Grandmother     Social History Social History   Tobacco Use   Smoking status: Never Smoker   Smokeless tobacco: Never Used  Building services engineer Use: Never assessed  Substance Use Topics   Alcohol use: No   Drug use: No     Allergies   Bactrim [sulfamethoxazole-trimethoprim], Povidone-iodine, Cephalexin, Ciprofloxacin, and Chlorhexidine   Review of Systems Review of Systems  Constitutional: Positive for fever.  HENT: Positive for congestion.   Respiratory: Positive for cough.    Physical Exam Triage Vital Signs ED Triage Vitals  Enc Vitals Group     BP 10/20/20 0924 (!) 165/98     Pulse Rate 10/20/20 0924 83     Resp 10/20/20 0924 14     Temp 10/20/20 0924 98.9 F (37.2 C)     Temp Source 10/20/20 0924 Oral     SpO2 10/20/20 0924 98 %     Weight 10/20/20 0921 220 lb (99.8 kg)     Height 10/20/20 0921 5\' 4"  (1.626 m)  Head Circumference --      Peak Flow --      Pain Score 10/20/20 0921 4     Pain Loc --      Pain Edu? --      Excl. in GC? --    Updated Vital Signs BP (!) 165/98 (BP Location: Right Arm)    Pulse 83    Temp 98.9 F (37.2 C) (Oral)    Resp 14    Ht 5\' 4"  (1.626 m)    Wt 99.8 kg    SpO2 98%    BMI 37.76 kg/m   Visual Acuity Right Eye Distance:   Left Eye Distance:   Bilateral Distance:    Right Eye Near:   Left Eye Near:    Bilateral Near:     Physical Exam Vitals and nursing note reviewed.  Constitutional:      General: She is not in acute distress.    Appearance: Normal appearance. She is obese. She is not ill-appearing.  HENT:     Head: Normocephalic and atraumatic.     Right Ear: Tympanic membrane normal.     Left Ear: Tympanic membrane  normal.     Nose: Congestion present.  Eyes:     General:        Right eye: No discharge.        Left eye: No discharge.     Conjunctiva/sclera: Conjunctivae normal.  Cardiovascular:     Rate and Rhythm: Normal rate and regular rhythm.  Pulmonary:     Effort: Pulmonary effort is normal.     Breath sounds: No wheezing or rales.  Neurological:     Mental Status: She is alert.  Psychiatric:        Mood and Affect: Mood normal.        Behavior: Behavior normal.    UC Treatments / Results  Labs (all labs ordered are listed, but only abnormal results are displayed) Labs Reviewed - No data to display  EKG   Radiology DG Chest 2 View  Result Date: 10/20/2020 CLINICAL DATA:  Cough, fever and congestion worsening over past few days, chest pain with coughing and fever for 2 days, history pneumonia EXAM: CHEST - 2 VIEW COMPARISON:  None FINDINGS: Normal heart size, mediastinal contours, and pulmonary vascularity. Lungs clear. No pleural effusion or pneumothorax. Bones unremarkable. IMPRESSION: No acute abnormalities. Electronically Signed   By: 13/06/2020 M.D.   On: 10/20/2020 10:50    Procedures Procedures (including critical care time)  Medications Ordered in UC Medications - No data to display  Initial Impression / Assessment and Plan / UC Course  I have reviewed the triage vital signs and the nursing notes.  Pertinent labs & imaging results that were available during my care of the patient were reviewed by me and considered in my medical decision making (see chart for details).    44 year old female presents with cough and congestion.  Chest x-ray obtained due to fever, cough, and history of pneumonia.  Chest x-ray was independently reviewed by me.  No acute findings.  Treating empirically for sinusitis with Augmentin.  Supportive care.  Final Clinical Impressions(s) / UC Diagnoses   Final diagnoses:  Acute sinusitis, recurrence not specified, unspecified location    Discharge Instructions   None    ED Prescriptions    Medication Sig Dispense Auth. Provider   amoxicillin-clavulanate (AUGMENTIN) 875-125 MG tablet Take 1 tablet by mouth 2 (two) times daily. 20 tablet [place], [place], [place]  PDMP not reviewed this encounter.   Tommie Sams, [place] 10/20/20 1111

## 2020-10-20 NOTE — ED Triage Notes (Signed)
Patient c/o sinus congestion and nasal congestion and dental pain that started on Thursday.  Patient denies fevers.

## 2020-10-24 ENCOUNTER — Telehealth: Payer: Self-pay

## 2020-10-24 NOTE — Telephone Encounter (Signed)
Copied from CRM [id number]. Topic: Quick Communication - See Telephone Encounter >> Oct 24, 2020  3:23 PM Aretta Nip wrote: CRM for notification. See Telephone encounter for: 10/24/20. Pls reach out to pt as was in Urgent Care Sunday  with extreme, extreme fatigue, chest congestion swarmy head and states thinks from her grandaugher's RSV. She was given an antibiotic at [facility]  and it is not working at all. She wants a FU call on to see if there is something she can be given to combat fatique. FU [phone]

## 2020-10-25 NOTE — Telephone Encounter (Signed)
Patient is calling KP back. Please advise CB- [phone]

## 2020-10-25 NOTE — Telephone Encounter (Signed)
Called pt left VM to call back.  KP 

## 2020-10-25 NOTE — Telephone Encounter (Signed)
Called pt told her to get a Covid test. Pt refused to get a Covid test. Told pt that Dr. Judithann Graves could not prescribe her any medications without being seen. Pt stated if she doesn't feel better over the weekend then she would make an office visit. Told pt she could also go to UC this weekend if she feels worse. Told pt to also drink plenty of fluids, get some rest and also take B12. Pt stated she has already been doing what was recommended.  KP

## 2022-09-15 ENCOUNTER — Ambulatory Visit
Admission: EM | Admit: 2022-09-15 | Discharge: 2022-09-15 | Disposition: A | Discharge status: Home / Self Care | Payer: Self-pay | Attending: Emergency Medicine | Admitting: Emergency Medicine

## 2022-09-15 DIAGNOSIS — J029 Acute pharyngitis, unspecified: Secondary | ICD-10-CM | POA: Insufficient documentation

## 2022-09-15 DIAGNOSIS — J069 Acute upper respiratory infection, unspecified: Secondary | ICD-10-CM | POA: Insufficient documentation

## 2022-09-15 LAB — GROUP A STREP BY PCR: Group A Strep by PCR: NOT DETECTED

## 2022-09-15 MED ORDER — BENZONATATE 100 MG PO CAPS: 200.0000 mg | ORAL_CAPSULE | Freq: Three times a day (TID) | ORAL | 0 refills | Status: AC

## 2022-09-15 MED ORDER — PROMETHAZINE-DM 6.25-15 MG/5ML PO SYRP: 5.0000 mL | ORAL_SOLUTION | Freq: Four times a day (QID) | ORAL | 0 refills | Status: AC | PRN

## 2022-09-15 MED ORDER — IPRATROPIUM BROMIDE 0.06 % NA SOLN: 2.0000 | Freq: Four times a day (QID) | NASAL | 12 refills | Status: AC

## 2022-09-15 NOTE — Discharge Instructions (Signed)
Your testing today was negative for strep.  I believe that your sore throat is being caused by your postnasal drip.  I do believe you have a viral upper respiratory infection. Use the Atrovent nasal spray, 2 squirts in each nostril every 6 hours, as needed for runny nose and postnasal drip.  Use the Tessalon Perles every 8 hours during the day.  Take them with a small sip of water.  They may give you some numbness to the base of your tongue or a metallic taste in your mouth, this is normal.  Use the Promethazine DM cough syrup at bedtime for cough and congestion.  It will make you drowsy so do not take it during the day.  Return for reevaluation or see your primary care provider for any new or worsening symptoms.

## 2022-09-15 NOTE — ED Provider Notes (Signed)
[facility]    CSN: [id number] Arrival date & time: 09/15/22  0825      History   Chief Complaint Chief Complaint  Patient presents with   Sore Throat   Fatigue    HPI Charlotte Davila is a 46 y.o. female.   HPI  31 old female here for evaluation of sore throat and fatigue.  Patient reports that one of her sons tested positive for strep and she developed sore throat, fatigue, runny nose, nasal congestion, postnasal drip, and a intermittent nonproductive cough last night.  No fever, abdominal pain, nausea, vomiting, or diarrhea.  He is requesting strep testing.  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      Home Medications    Prior to Admission medications   Medication Sig Start Date End Date Taking? Authorizing Provider  benzonatate (TESSALON) 100 MG capsule Take 2 capsules (200 mg total) by mouth every 8 (eight) hours. 09/15/22  Yes Margarette Canada, NP  ipratropium (ATROVENT) 0.06 % nasal spray Place 2 sprays into both nostrils 4 (four) times daily. 09/15/22  Yes Margarette Canada, NP  promethazine-dextromethorphan (PROMETHAZINE-DM) 6.25-15 MG/5ML syrup Take 5 mLs by mouth 4 (four) times daily as needed. 09/15/22  Yes Margarette Canada, NP  Cholecalciferol 25 MCG (1000 UT) tablet Take by mouth.    [provider]  Multiple Vitamin (MULTIVITAMIN) capsule Take 1 capsule by mouth daily.    [provider]  RHOFADE 1 % CREA APPLY ONE PEA SIZE AMOUNT ON EACH CHEEK AND SPREAD TO ALL AFFECTED AREAS 09/03/20    [provider]  SOOLANTRA 1 % CREA Apply 1 application topically daily. 09/03/20   [provider]    Family History Family History  Problem Relation Age of Onset   Cervical cancer Mother    Stroke Mother    Hypertension Mother    Atrial fibrillation Father    Skin cancer Maternal Grandfather    Skin cancer Paternal Grandmother    Prostate cancer Paternal Grandmother     Social History Social History   Tobacco Use   Smoking status: Never   Smokeless tobacco: Never  Substance Use Topics   Alcohol use: No   Drug use: No     Allergies   Bactrim [sulfamethoxazole-trimethoprim], Povidone-iodine, Cephalexin, Ciprofloxacin, and Chlorhexidine   Review of Systems Review of Systems  Constitutional:  Positive for fatigue. Negative for fever.  HENT:  Positive for congestion, postnasal drip, rhinorrhea and sore throat.   Respiratory:  Positive for cough. Negative for shortness of breath and wheezing.   Gastrointestinal:  Negative for abdominal pain, diarrhea, nausea and vomiting.     Physical Exam Triage Vital Signs ED Triage Vitals  Enc Vitals Group     BP 09/15/22 0908 (!) 143/84     Pulse Rate 09/15/22 0908 72     Resp 09/15/22 0908 16     Temp 09/15/22 0908 98.7 F (37.1 C)  Temp Source 09/15/22 0908 Oral     SpO2 09/15/22 0908 100 %     Weight --      Height --      Head Circumference --      Peak Flow --      Pain Score 09/15/22 0910 3     Pain Loc --      Pain Edu? --      Excl. in GC? --    No data found.  Updated Vital Signs BP (!) 143/84 (BP Location: Left Arm)   Pulse 72   Temp 98.7 F (37.1 C) (Oral)   Resp 16   LMP 09/02/2022 (Approximate)   SpO2 100%   Visual Acuity Right Eye Distance:   Left Eye Distance:   Bilateral Distance:    Right Eye Near:   Left Eye Near:    Bilateral Near:     Physical Exam Vitals and nursing note reviewed.  Constitutional:      Appearance: Normal appearance. She is not ill-appearing.   HENT:     Head: Normocephalic and atraumatic.     Right Ear: Tympanic membrane, ear canal and external ear normal. There is no impacted cerumen.     Left Ear: Tympanic membrane, ear canal and external ear normal. There is no impacted cerumen.     Nose: Congestion and rhinorrhea present.     Mouth/Throat:     Mouth: Mucous membranes are moist.     Pharynx: Oropharynx is clear. Posterior oropharyngeal erythema present. No oropharyngeal exudate.  Cardiovascular:     Rate and Rhythm: Normal rate and regular rhythm.     Pulses: Normal pulses.     Heart sounds: Normal heart sounds. No murmur heard.    No friction rub. No gallop.  Pulmonary:     Effort: Pulmonary effort is normal.     Breath sounds: Normal breath sounds. No wheezing, rhonchi or rales.  Musculoskeletal:     Cervical back: Normal range of motion and neck supple.  Lymphadenopathy:     Cervical: No cervical adenopathy.  Skin:    General: Skin is warm and dry.     Capillary Refill: Capillary refill takes less than 2 seconds.  Neurological:     General: No focal deficit present.     Mental Status: She is alert and oriented to person, place, and time.  Psychiatric:        Mood and Affect: Mood normal.        Behavior: Behavior normal.        Thought Content: Thought content normal.        Judgment: Judgment normal.      UC Treatments / Results  Labs (all labs ordered are listed, but only abnormal results are displayed) Labs Reviewed  GROUP A STREP BY PCR    EKG   Radiology No results found.  Procedures Procedures (including critical care time)  Medications Ordered in UC Medications - No data to display  Initial Impression / Assessment and Plan / UC Course  I have reviewed the triage vital signs and the nursing notes.  Pertinent labs & imaging results that were available during my care of the patient were reviewed by me and considered in my medical decision making (see chart for details).   Patient is a  very pleasant, nontoxic-appearing 46 year old female with a past medical history that is significant for thyroid disease, thyroid storm, macular degeneration, and hyperlipidemia presenting for evaluation of sore throat, fatigue, and upper respiratory symptoms  that began last night.  No fever.  On exam she does have upper respiratory inflammation demonstrated by inflamed nasal mucosa with clear rhinorrhea.  Oral pharyngeal exam reveals erythematous edematous tonsillar pillars without exudate.  Posterior oropharynx is also erythematous injected with clear postnasal drip.  No cervical of adenopathy on exam and lungs are clear to auscultation.  Patient does have clear signs of an upper respiratory infection.  Given that her son tested positive for strep I will order a strep PCR.  If strep is negative I will treat her for a viral URI.  Strep PCR is negative.  I will discharge patient home with a diagnosis of viral URI with postnasal drip.  I will discharge her home with Atrovent nasal spray, Tessalon Perles, Promethazine DM cough syrup.   Final Clinical Impressions(s) / UC Diagnoses   Final diagnoses:  Viral URI with cough  Viral pharyngitis     Discharge Instructions      Your testing today was negative for strep.  I believe that your sore throat is being caused by your postnasal drip.  I do believe you have a viral upper respiratory infection. Use the Atrovent nasal spray, 2 squirts in each nostril every 6 hours, as needed for runny nose and postnasal drip.  Use the Tessalon Perles every 8 hours during the day.  Take them with a small sip of water.  They may give you some numbness to the base of your tongue or a metallic taste in your mouth, this is normal.  Use the Promethazine DM cough syrup at bedtime for cough and congestion.  It will make you drowsy so do not take it during the day.  Return for reevaluation or see your primary care provider for any new or worsening symptoms.      ED  Prescriptions     Medication Sig Dispense Auth. Provider   benzonatate (TESSALON) 100 MG capsule Take 2 capsules (200 mg total) by mouth every 8 (eight) hours. 21 capsule Becky Augusta, NP   ipratropium (ATROVENT) 0.06 % nasal spray Place 2 sprays into both nostrils 4 (four) times daily. 15 mL Becky Augusta, NP   promethazine-dextromethorphan (PROMETHAZINE-DM) 6.25-15 MG/5ML syrup Take 5 mLs by mouth 4 (four) times daily as needed. 118 mL Becky Augusta, NP      PDMP not reviewed this encounter.   Becky Augusta, NP 09/15/22 1101

## 2022-09-15 NOTE — ED Triage Notes (Signed)
Patient presents to UC for sore throat and fatigue since last night. Req strep testing. Son tested positive. Not taking any medications.
